# Patient Record
Sex: Male | Born: 1970 | Race: White | Hispanic: No | Marital: Married | Smoking: Never smoker
Health system: Southern US, Community
[De-identification: ages and names within clinical notes are randomized; demographics above are authoritative.]

## PROBLEM LIST (undated history)

## (undated) DIAGNOSIS — I10 Essential (primary) hypertension: Secondary | ICD-10-CM

## (undated) HISTORY — PX: SEPTOPLASTY: SUR1290

## (undated) HISTORY — PX: ADENOIDECTOMY: SUR15

---

## 2017-10-21 ENCOUNTER — Other Ambulatory Visit: Payer: Self-pay

## 2017-10-21 ENCOUNTER — Emergency Department (INDEPENDENT_AMBULATORY_CARE_PROVIDER_SITE_OTHER)
Admission: EM | Admit: 2017-10-21 | Discharge: 2017-10-21 | Disposition: A | Discharge status: Home / Self Care | Payer: BLUE CROSS/BLUE SHIELD | Source: Home / Self Care | Attending: Family Medicine | Admitting: Family Medicine

## 2017-10-21 ENCOUNTER — Emergency Department (INDEPENDENT_AMBULATORY_CARE_PROVIDER_SITE_OTHER): Payer: BLUE CROSS/BLUE SHIELD

## 2017-10-21 ENCOUNTER — Encounter: Payer: Self-pay | Admitting: *Deleted

## 2017-10-21 DIAGNOSIS — R918 Other nonspecific abnormal finding of lung field: Secondary | ICD-10-CM | POA: Diagnosis not present

## 2017-10-21 DIAGNOSIS — M940 Chondrocostal junction syndrome [Tietze]: Secondary | ICD-10-CM

## 2017-10-21 DIAGNOSIS — I10 Essential (primary) hypertension: Secondary | ICD-10-CM

## 2017-10-21 HISTORY — DX: Essential (primary) hypertension: I10

## 2017-10-21 LAB — POCT URINALYSIS DIP (MANUAL ENTRY)
BILIRUBIN UA: NEGATIVE
GLUCOSE UA: NEGATIVE mg/dL
Leukocytes, UA: NEGATIVE
Nitrite, UA: NEGATIVE
Protein Ur, POC: NEGATIVE mg/dL
RBC UA: NEGATIVE
SPEC GRAV UA: 1.015 (ref 1.010–1.025)
Urobilinogen, UA: 0.2 E.U./dL
pH, UA: 7 (ref 5.0–8.0)

## 2017-10-21 NOTE — ED Provider Notes (Signed)
[facility]    CSN: [id number] Arrival date & time: 10/21/17  1914     History   Chief Complaint Chief Complaint  Patient presents with  . Chest Pain  . Hypertension    HPI Gerald Gill is a 46 y.o. male.   Patient complains of onset of tightness in his anterior chest today that has persisted.  The discomfort is present at rest.  He denies recent URI. He has had elevated BP for about a year, with home measurements of approximately 124-142 / 90-102.  He became concerned today with onset of chest discomfort.  He reports that he has an appointment with his PCP in two weeks for evaluation of hypertension. He has a family history of hypertension (father and paternal grandfather).  Paternal grandfather had a stroke.  Father has diabetes. Social history:  No tobacco use   The history is provided by the patient.    Past Medical History:  Diagnosis Date  . Hypertension     There are no active problems to display for this patient.   Past Surgical History:  Procedure Laterality Date  . ADENOIDECTOMY    . SEPTOPLASTY         Home Medications    Prior to Admission medications   Not on File    Family History Family History  Problem Relation Age of Onset  . Alzheimer's disease Mother   . Diabetes Father   . Hypertension Father     Social History Social History   Tobacco Use  . Smoking status: Never Smoker  . Smokeless tobacco: Never Used  Substance Use Topics  . Alcohol use: Yes  . Drug use: No     Allergies   Patient has no known allergies.   Review of Systems Review of Systems  Constitutional: Negative.   HENT: Negative.   Eyes: Negative.   Respiratory: Positive for chest tightness. Negative for cough, shortness of breath and wheezing.   Cardiovascular: Negative for chest pain, palpitations and leg swelling.  Gastrointestinal: Negative.   Genitourinary: Negative.   Musculoskeletal: Negative.   Skin: Negative.   Neurological:  Negative.       Physical Exam Triage Vital Signs ED Triage Vitals [10/21/17 1955]  Enc Vitals Group     BP (!) 171/117     Pulse Rate 74     Resp 18     Temp 98.3 F (36.8 C)     Temp Source Oral     SpO2 100 %     Weight 170 lb (77.1 kg)     Height 5' 10.5" (1.791 m)     Head Circumference      Peak Flow      Pain Score 3     Pain Loc      Pain Edu?      Excl. in GC?    No data found.  Updated Vital Signs BP (!) 171/117 (BP Location: Right Arm)   Pulse 74   Temp 98.3 F (36.8 C) (Oral)   Resp 18   Ht 5' 10.5" (1.791 m)   Wt 170 lb (77.1 kg)   SpO2 100%   BMI 24.05 kg/m   Visual Acuity Right Eye Distance:   Left Eye Distance:   Bilateral Distance:    Right Eye Near:   Left Eye Near:    Bilateral Near:     Physical Exam Nursing notes and Vital Signs reviewed. Appearance:  Patient appears stated age, and in no acute distress Eyes:  Pupils are equal, round, and reactive to light and accomodation.  Extraocular movement is intact.  Conjunctivae are not inflamed.  Fundi benign  Ears:  Canals normal.  Tympanic membranes normal.  Nose:  Normal turbinates.  No sinus tenderness.  Pharynx:  Normal Neck:  Supple.  Enlarged posterior/lateral nodes are palpated bilaterally, tender to palpation on the left.  No thyromegaly.  Carotids have normal upstrokes without bruits. Lungs:  Clear to auscultation.  Breath sounds are equal.  Moving air well. Chest:  Distinct tenderness to palpation over the mid-sternum.  Heart:  Regular rate and rhythm without murmurs, rubs, or gallops.  Abdomen:  Nontender without masses or hepatosplenomegaly.  Bowel sounds are present.  No CVA or flank tenderness.  Extremities:  No edema. Pedal pulses intact. Skin:  No rash present.    UC Treatments / Results  Labs (all labs ordered are listed, but only abnormal results are displayed) Labs Reviewed  CBC WITH DIFFERENTIAL/PLATELET  COMPLETE METABOLIC PANEL WITH GFR  POCT URINALYSIS DIP (MANUAL  ENTRY) Normal    EKG  EKG Interpretation  Rate:  66 BPM PR:  128 msec QT:  422 msec QTcH:  442 msec QRSD:  96 msec QRS axis:  -2 degrees Interpretation:   Possible left atrial enlargement.  Normal sinus rhythm.  No acute changes.       Radiology Dg Chest 2 View  Result Date: 10/21/2017 CLINICAL DATA:  46 year old male presenting with elevated blood pressure, chest tightness and dyspnea. EXAM: CHEST  2 VIEW COMPARISON:  None. FINDINGS: The heart size and mediastinal contours are within normal limits. Hyperinflated appearance of the lungs without pneumonic consolidation or CHF. Tiny 5 mm nodular opacity in the right upper lobe projecting over the anterior right third rib. The visualized skeletal structures are unremarkable. IMPRESSION: 1. Mild hyperinflation of the lungs. 2. No active pulmonary disease. 3. 5 mm nodular opacity in the right upper lobe possibly representing a vascular summation or small fissural lymph node. Small pulmonary nodule is not excluded. Nonemergent chest CT may help for further correlation. Electronically Signed   By: Tollie Ethavid  Kwon M.D.   On: 10/21/2017 20:18    Procedures Procedures (including critical care time)  Medications Ordered in UC Medications - No data to display   Initial Impression / Assessment and Plan / UC Course  I have reviewed the triage vital signs and the nursing notes.  Pertinent labs & imaging results that were available during my care of the patient were reviewed by me and considered in my medical decision making (see chart for details).    Patient's costochondritis may represent early onset of a viral URI; note mild cervical adenopathy. No acute changes on EKG; no evidence LVH.  U/A shows no proteinuria. CBC, CMP pending. Patient agrees to follow-up with PCP for likely initiation of anti-hypertensive medication. Monitor blood pressure more frequently at different times of day and record on a calendar. Followup with PCP as scheduled  for evaluation of BP    Final Clinical Impressions(s) / UC Diagnoses   Final diagnoses:  Essential hypertension  Costochondritis    ED Discharge Orders    None           Lattie HawBeese, Stephen A, MD 10/22/17 [phone]

## 2017-10-21 NOTE — ED Triage Notes (Signed)
Pt c/o tightness in the middle of his chest x today. denies recent URI.  He notes elevated BP for a year. He has an appt with his PCP for a CPE in 2 wks.

## 2017-10-21 NOTE — Discharge Instructions (Signed)
Monitor blood pressure more frequently at different times of day and record on a calendar. °

## 2017-10-22 ENCOUNTER — Telehealth: Payer: Self-pay | Admitting: *Deleted

## 2017-10-22 LAB — CBC WITH DIFFERENTIAL/PLATELET
BASOS PCT: 0.2 %
Basophils Absolute: 11 cells/uL (ref 0–200)
EOS ABS: 38 {cells}/uL (ref 15–500)
Eosinophils Relative: 0.7 %
HCT: 45 % (ref 38.5–50.0)
HEMOGLOBIN: 15.9 g/dL (ref 13.2–17.1)
Lymphs Abs: 2176 cells/uL (ref 850–3900)
MCH: 30.9 pg (ref 27.0–33.0)
MCHC: 35.3 g/dL (ref 32.0–36.0)
MCV: 87.5 fL (ref 80.0–100.0)
MONOS PCT: 6.6 %
MPV: 11 fL (ref 7.5–12.5)
NEUTROS PCT: 52.2 %
Neutro Abs: 2819 cells/uL (ref 1500–7800)
PLATELETS: 228 10*3/uL (ref 140–400)
RBC: 5.14 10*6/uL (ref 4.20–5.80)
RDW: 11.9 % (ref 11.0–15.0)
TOTAL LYMPHOCYTE: 40.3 %
WBC: 5.4 10*3/uL (ref 3.8–10.8)
WBCMIX: 356 {cells}/uL (ref 200–950)

## 2017-10-22 LAB — COMPLETE METABOLIC PANEL WITH GFR
AG Ratio: 1.8 (calc) (ref 1.0–2.5)
ALT: 20 U/L (ref 9–46)
AST: 23 U/L (ref 10–40)
Albumin: 4.7 g/dL (ref 3.6–5.1)
Alkaline phosphatase (APISO): 62 U/L (ref 40–115)
BUN: 8 mg/dL (ref 7–25)
CALCIUM: 9.6 mg/dL (ref 8.6–10.3)
CO2: 28 mmol/L (ref 20–32)
Chloride: 103 mmol/L (ref 98–110)
Creat: 0.89 mg/dL (ref 0.60–1.35)
GFR, EST AFRICAN AMERICAN: 118 mL/min/{1.73_m2} (ref >=60)
GFR, EST NON AFRICAN AMERICAN: 102 mL/min/{1.73_m2} (ref >=60)
GLUCOSE: 96 mg/dL (ref 65–99)
Globulin: 2.6 g/dL (calc) (ref 1.9–3.7)
Potassium: 3.7 mmol/L (ref 3.5–5.3)
Sodium: 138 mmol/L (ref 135–146)
TOTAL PROTEIN: 7.3 g/dL (ref 6.1–8.1)
Total Bilirubin: 0.8 mg/dL (ref 0.2–1.2)

## 2017-10-22 NOTE — Telephone Encounter (Signed)
LM with lab results and to call back if he has any questions or concerns.  

## 2018-10-28 IMAGING — DX DG CHEST 2V
2 series · 2 of 2 positions shown · non-contrast
Comparison: None.

CLINICAL DATA: 46-year-old male presenting with elevated blood
pressure, chest tightness and dyspnea.

EXAM:
CHEST  2 VIEW

[chest pa]
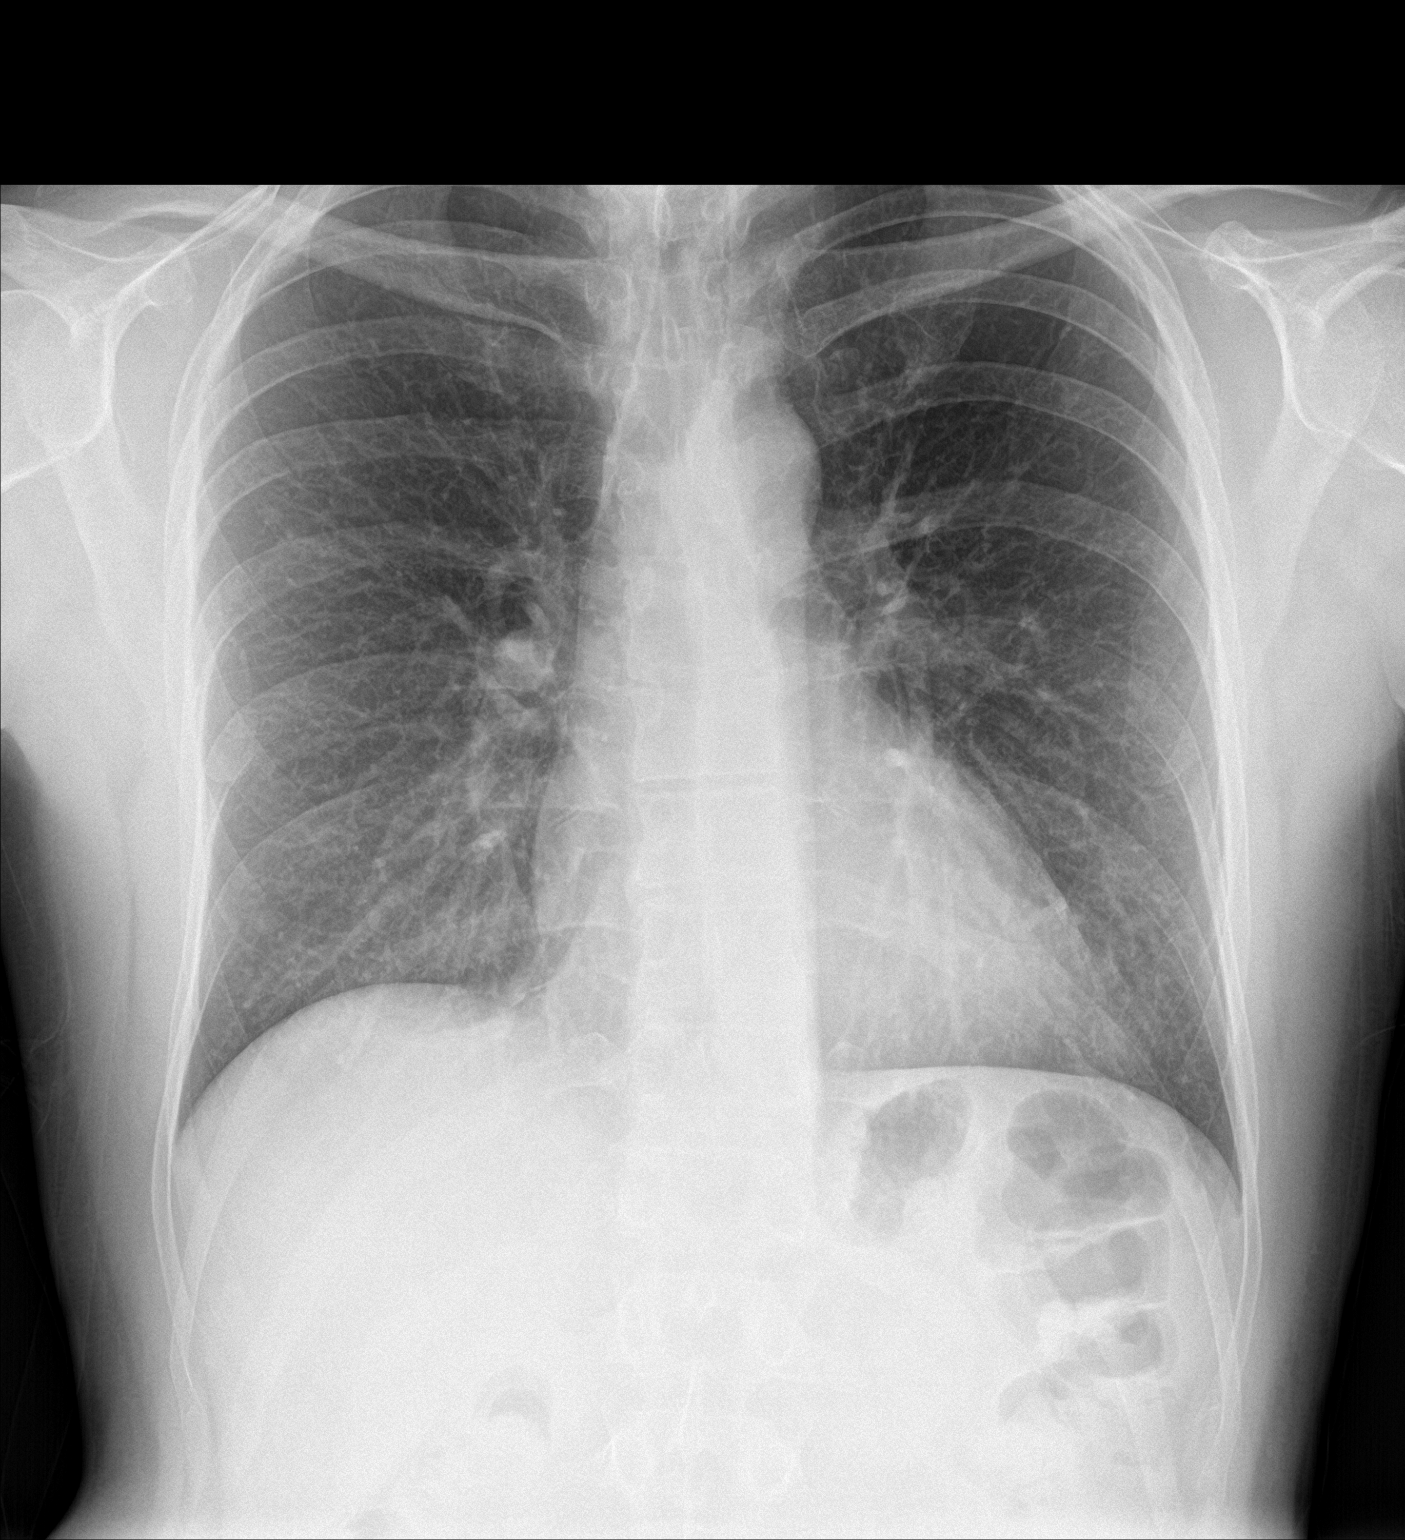

[chest lat]
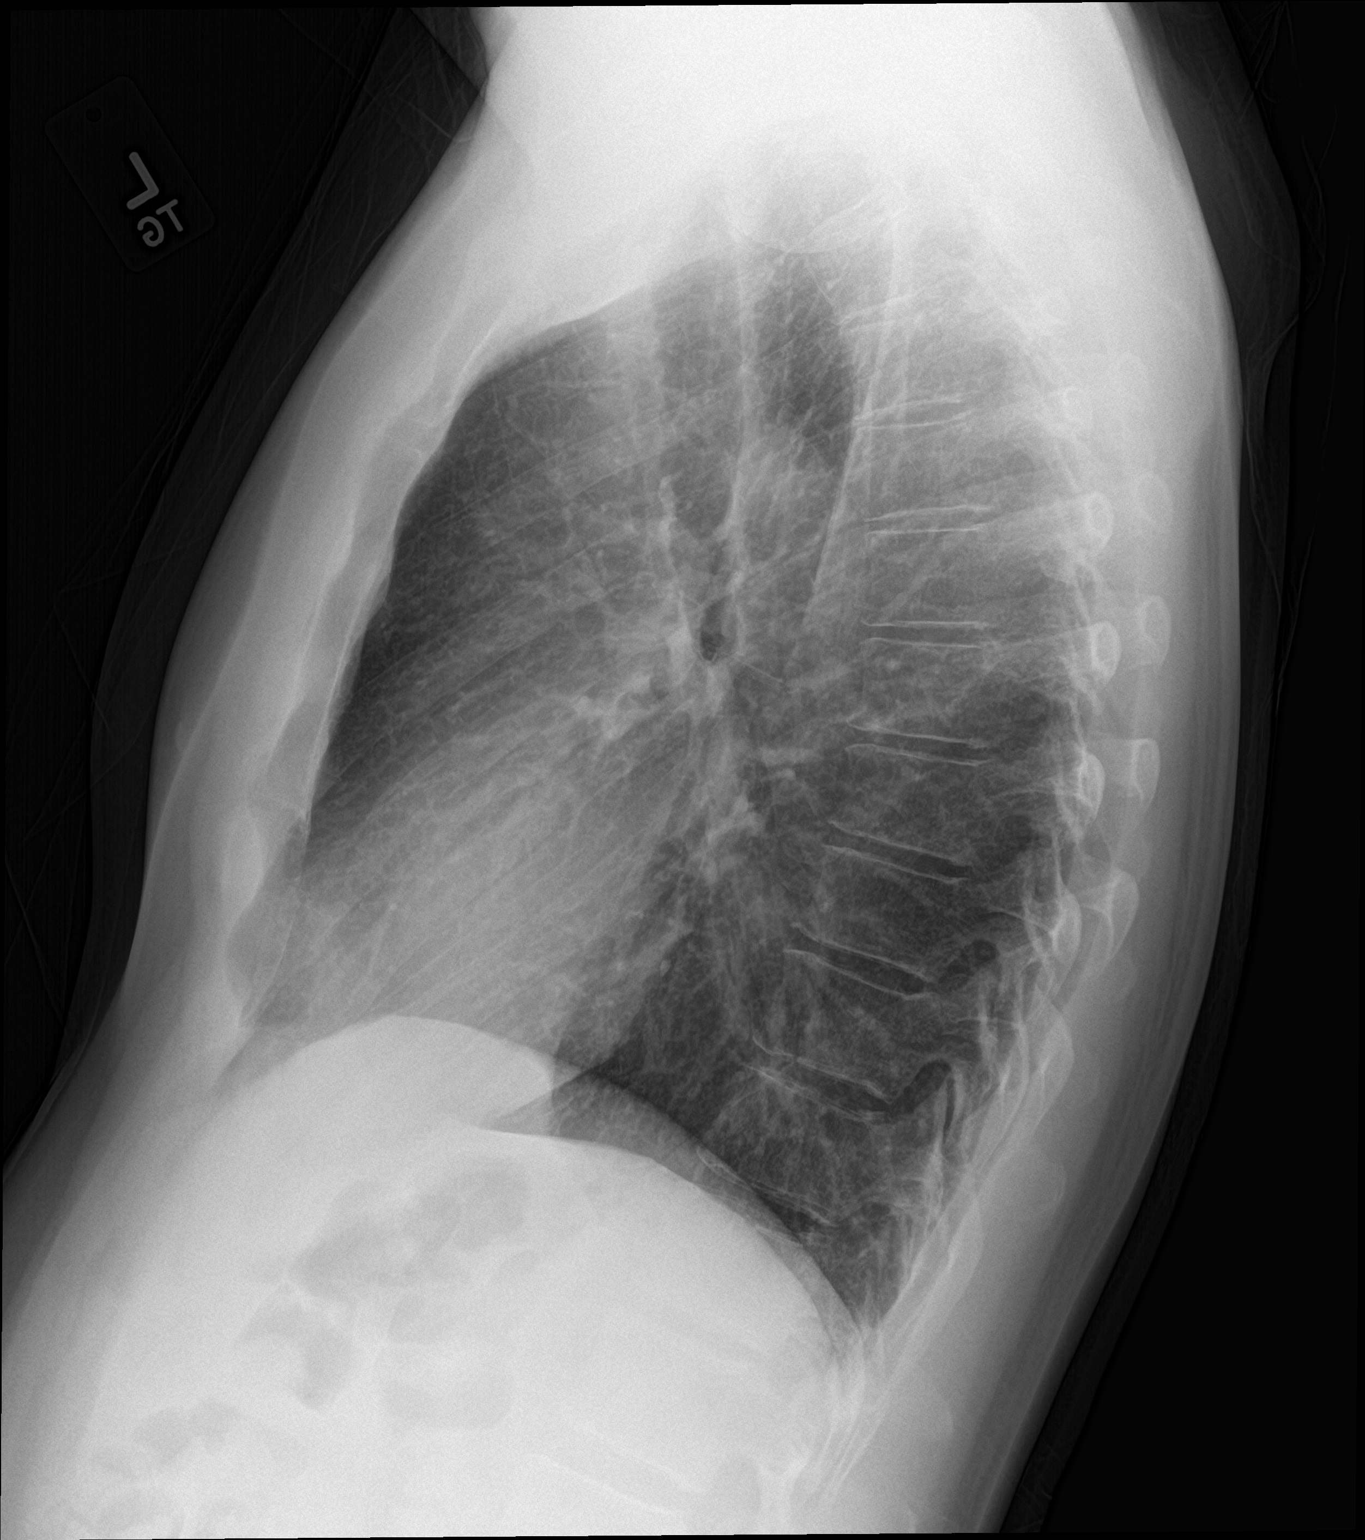

[2 of 2 positions shown; findings below may reference images not displayed]

FINDINGS: The heart size and mediastinal contours are within normal limits.
Hyperinflated appearance of the lungs without pneumonic
consolidation or CHF. Tiny 5 mm nodular opacity in the right upper
lobe projecting over the anterior right third rib. The visualized
skeletal structures are unremarkable.
IMPRESSION: 1. Mild hyperinflation of the lungs.
2. No active pulmonary disease.
3. 5 mm nodular opacity in the right upper lobe possibly
representing a vascular summation or small fissural lymph node.
Small pulmonary nodule is not excluded. Nonemergent chest CT may
help for further correlation.
